# Patient Record
Sex: Male | Born: 1970 | Race: White | Hispanic: No | Marital: Married | Smoking: Never smoker
Health system: Southern US, Community
[De-identification: ages and names within clinical notes are randomized; demographics above are authoritative.]

## PROBLEM LIST (undated history)

## (undated) DIAGNOSIS — R51 Headache: Secondary | ICD-10-CM

## (undated) DIAGNOSIS — G8929 Other chronic pain: Secondary | ICD-10-CM

## (undated) DIAGNOSIS — G4730 Sleep apnea, unspecified: Secondary | ICD-10-CM

## (undated) HISTORY — PX: KNEE ARTHROSCOPY: SHX127

---

## 2009-08-31 ENCOUNTER — Emergency Department (HOSPITAL_BASED_OUTPATIENT_CLINIC_OR_DEPARTMENT_OTHER): Admission: EM | Admit: 2009-08-31 | Discharge: 2009-09-01 | Payer: Self-pay | Admitting: Emergency Medicine

## 2013-05-14 ENCOUNTER — Encounter (HOSPITAL_BASED_OUTPATIENT_CLINIC_OR_DEPARTMENT_OTHER): Payer: Self-pay | Admitting: Emergency Medicine

## 2013-05-14 ENCOUNTER — Emergency Department (HOSPITAL_BASED_OUTPATIENT_CLINIC_OR_DEPARTMENT_OTHER)
Admission: EM | Admit: 2013-05-14 | Discharge: 2013-05-14 | Disposition: A | Discharge status: Home / Self Care | Payer: Self-pay | Attending: Emergency Medicine | Admitting: Emergency Medicine

## 2013-05-14 DIAGNOSIS — K029 Dental caries, unspecified: Secondary | ICD-10-CM | POA: Insufficient documentation

## 2013-05-14 DIAGNOSIS — Z8669 Personal history of other diseases of the nervous system and sense organs: Secondary | ICD-10-CM | POA: Insufficient documentation

## 2013-05-14 HISTORY — DX: Sleep apnea, unspecified: G47.30

## 2013-05-14 MED ORDER — IBUPROFEN 800 MG PO TABS: 800.0000 mg | ORAL_TABLET | Freq: Three times a day (TID) | ORAL | Status: AC | PRN

## 2013-05-14 MED ORDER — TRAMADOL HCL 50 MG PO TABS: 50.0000 mg | ORAL_TABLET | Freq: Four times a day (QID) | ORAL | Status: AC | PRN

## 2013-05-14 MED ORDER — PENICILLIN V POTASSIUM 500 MG PO TABS
500.0000 mg | ORAL_TABLET | Freq: Four times a day (QID) | ORAL | Status: AC
Start: 2013-05-14 — End: ?

## 2013-05-14 NOTE — ED Provider Notes (Signed)
TIME SEEN: 2:12 PM  CHIEF COMPLAINT: dental pain   HPI: Patient is a 43 y.o. male with a history of obstructive sleep apnea who presents to the emergency department with dental pain that started last night. Patient reports that he was eating and one of his left lower teeth broke causing him to have pain. He denies that he's had any swelling of his face or gums or neck. No difficulty swallowing or breathing. No fever. No drainage. He does not have a dentist. Patient has very poor dentition and he reports that he uses dip.  ROS: See HPI Constitutional: no fever  Eyes: no drainage  ENT: no runny nose   Cardiovascular:  no chest pain  Resp: no SOB  GI: no vomiting GU: no dysuria Integumentary: no rash  Allergy: no hives  Musculoskeletal: no leg swelling  Neurological: no slurred speech ROS otherwise negative  PAST MEDICAL HISTORY/PAST SURGICAL HISTORY:  Past Medical History  Diagnosis Date  . Sleep apnea     MEDICATIONS:  Prior to Admission medications   Medication Sig Start Date End Date Taking? Authorizing Provider  acetaminophen (TYLENOL) 500 MG tablet Take 1,000 mg by mouth every 6 (six) hours as needed.   Yes Historical Provider, MD    ALLERGIES:  Allergies no known allergies  SOCIAL HISTORY:  History  Substance Use Topics  . Smoking status: Never Smoker   . Smokeless tobacco: Current User  . Alcohol Use: No    FAMILY HISTORY: History reviewed. No pertinent family history.  EXAM: BP 154/97  Pulse 75  Temp(Src) 98.2 F (36.8 C)  Resp 16  Ht 6\' 6"  (1.981 m)  Wt 300 lb (136.079 kg)  BMI 34.68 kg/m2  SpO2 98% CONSTITUTIONAL: Alert and oriented and responds appropriately to questions. Well-appearing; well-nourished HEAD: Normocephalic EYES: Conjunctivae clear, PERRL ENT: normal nose; no rhinorrhea; moist mucous membranes; pharynx without lesions noted; no trismus or drooling, no uvular deviation, no muffled voice, no submandibular swelling, no swelling  underneath the tongue, patient has multiple dental caries, patient has multiple fractured teeth including left lower canine and molar that are tender to palpation with no gum swelling or erythema or purulent drainage NECK: Supple, no meningismus, no LAD  CARD: RRR; S1 and S2 appreciated; no murmurs, no clicks, no rubs, no gallops RESP: Normal chest excursion without splinting or tachypnea; breath sounds clear and equal bilaterally; no wheezes, no rhonchi, no rales,  ABD/GI: Normal bowel sounds; non-distended; soft, non-tender, no rebound, no guarding BACK:  The back appears normal and is non-tender to palpation, there is no CVA tenderness EXT: Normal ROM in all joints; non-tender to palpation; no edema; normal capillary refill; no cyanosis    SKIN: Normal color for age and race; warm NEURO: Moves all extremities equally PSYCH: The patient's mood and manner are appropriate. Grooming and personal hygiene are appropriate.  MEDICAL DECISION MAKING: Patient here with fractured teeth, dental caries without obvious sign of infection. Have discussed with patient that he needs to followup with a dentist. We'll discharge with prescription for ibuprofen, tramadol and penicillin. Have given return precautions. Patient verbalizes understanding and is comfortable with plan.      Layla MawKristen N Saman Umstead, DO 05/14/13 1415

## 2013-05-14 NOTE — Discharge Instructions (Signed)
Dental Caries  Dental caries (also called tooth decay) is the most common oral disease. It can occur at any age, but is more common in children and young adults.  HOW DENTAL CARIES DEVELOPS  The process of decay begins when bacteria and foods (particularly sugars and starches) combine in your mouth to produce plaque. Plaque is a substance that sticks to the hard, outer surface of a tooth (enamel). The bacteria in plaque produce acids that attack enamel. These acids may also attack the root surface of a tooth (cementum) if it is exposed. Repeated attacks dissolve these surfaces and create holes in the tooth (cavities). If left untreated, the acids destroy the other layers of the tooth.  RISK FACTORS  Frequent sipping of sugary beverages.   Frequent snacking on sugary and starchy foods, especially those that easily get stuck in the teeth.   Poor oral hygiene.   Dry mouth.   Substance abuse such as methamphetamine abuse.   Broken or poor-fitting dental restorations.   Eating disorders.   Gastroesophageal reflux disease (GERD).   Certain radiation treatments to the head and neck. SYMPTOMS In the early stages of dental caries, symptoms are seldom present. Sometimes white, chalky areas may be seen on the enamel or other tooth layers. In later stages, symptoms may include:  Pits and holes on the enamel.  Toothache after sweet, hot, or cold foods or drinks are consumed.  Pain around the tooth.  Swelling around the tooth. DIAGNOSIS  Most of the time, dental caries is detected during a regular dental checkup. A diagnosis is made after a thorough medical and dental history is taken and the surfaces of your teeth are checked for signs of dental caries. Sometimes special instruments, such as lasers, are used to check for dental caries. Dental X-ray exams may be taken so that areas not visible to the eye (such as between the contact areas of the teeth) can be checked for cavities.    TREATMENT  If dental caries is in its early stages, it may be reversed with a fluoride treatment or an application of a remineralizing agent at the dental office. Thorough brushing and flossing at home is needed to aid these treatments. If it is in its later stages, treatment depends on the location and extent of tooth destruction:   If a small area of the tooth has been destroyed, the destroyed area will be removed and cavities will be filled with a material such as gold, silver amalgam, or composite resin.   If a large area of the tooth has been destroyed, the destroyed area will be removed and a cap (crown) will be fitted over the remaining tooth structure.   If the center part of the tooth (pulp) is affected, a procedure called a root canal will be needed before a filling or crown can be placed.   If most of the tooth has been destroyed, the tooth may need to be pulled (extracted). HOME CARE INSTRUCTIONS You can prevent, stop, or reverse dental caries at home by practicing good oral hygiene. Good oral hygiene includes:  Thoroughly cleaning your teeth at least twice a day with a toothbrush and dental floss.   Using a fluoride toothpaste. A fluoride mouth rinse may also be used if recommended by your dentist or health care provider.   Restricting the amount of sugary and starchy foods and sugary liquids you consume.   Avoiding frequent snacking on these foods and sipping of these liquids.   Keeping regular visits  with a dentist for checkups and cleanings. °PREVENTION  °· Practice good oral hygiene. °· Consider a dental sealant. A dental sealant is a coating material that is applied by your dentist to the pits and grooves of teeth. The sealant prevents food from being trapped in them. It may protect the teeth for several years. °· Ask about fluoride supplements if you live in a community without fluorinated water or with water that has a low fluoride content. Use fluoride supplements  as directed by your dentist or health care provider. °· Allow fluoride varnish applications to teeth if directed by your dentist or health care provider. °Document Released: 12/16/2001 Document Revised: 11/26/2012 Document Reviewed: 03/28/2012 °ExitCare® Patient Information ©2014 [employer]. ° ° °Emergency Department Resource Guide °1) Find a Doctor and Pay Out of Pocket °Although you won't have to find out who is covered by your insurance plan, it is a good idea to ask around and get recommendations. You will then need to call the office and see if the doctor you have chosen will accept you as a new patient and what types of options they offer for patients who are self-pay. Some doctors offer discounts or will set up payment plans for their patients who do not have insurance, but you will need to ask so you aren't surprised when you get to your appointment. ° °2) Contact Your Local Health Department °Not all health departments have doctors that can see patients for sick visits, but many do, so it is worth a call to see if yours does. If you don't know where your local health department is, you can check in your phone book. The CDC also has a tool to help you locate your state's health department, and many state websites also have listings of all of their local health departments. ° °3) Find a Walk-in Clinic °If your illness is not likely to be very severe or complicated, you may want to try a walk in clinic. These are popping up all over the country in pharmacies, drugstores, and shopping centers. They're usually staffed by nurse practitioners or physician assistants that have been trained to treat common illnesses and complaints. They're usually fairly quick and inexpensive. However, if you have serious medical issues or chronic medical problems, these are probably not your best option. ° °No Primary Care Doctor: °- Call Health Connect at  [phone] - they can help you locate a primary care doctor that  accepts  your insurance, provides certain services, etc. °- Physician Referral Service- 1-800-533-3463 ° °Chronic Pain Problems: °Organization         Address  Phone   Notes  °[facility]  ([phone] Patients need to be referred by their primary care doctor.  ° °Medication Assistance: °Organization         Address  Phone   Notes  °[place] [facility] [address] °[place], [place] [address] [phone] --Must be a resident of [place] °-- Must have NO insurance coverage whatsoever (no Medicaid/ Medicare, etc.) °-- The pt. MUST have a primary care doctor that directs their care regularly and follows them in the community °  °[facility]  (866) 331-1348   °[employer]  (888) 892-1162   ° °Agencies that provide inexpensive medical care: °Organization         Address  Phone   Notes  °[facility]  ([phone]   °[facility]    ([phone]   °  [facility] [place], [place] [address] [phone]   [place]. [address] [place] [phone]   Planned Parenthood    [phone]   [facility]    [phone]   [place] and [place] [address], [place] Phone:  [phone], Fax:  [phone] Hours of Operation:  9 am - 6 pm, M-F.  Also accepts Medicaid/Medicare and self-pay.  [facility] for [place] [address], [address], [place] Phone: [phone], Fax: [phone]. Hours of Operation:  8:30 am - 5:30 pm, M-F.  Also accepts Medicaid and self-pay.  [facility] [place] [address], [place] Phone: [phone]   [place], [place], [place] [phone], Ext. [phone] Mondays & Thursdays: 7-9 AM.  First 15 patients are seen on a first come, first serve basis.    [facility] Providers:  Organization          Address  Phone   Notes  [facility] [address], [place] [phone] Also accepts self-pay patients.  [facility] [id number] [place], [place]  [phone]   [place], [address], [place] [phone]   [facility] [address], [place] [phone]   Lucianne Lei [address], [place]   [phone] Only accepts [place] Access [place] patients after they have their name applied to their card.   Self-Pay (no insurance) in [facility]:  Organization         Address  Phone   Notes  Sickle Cell Patients, [facility] [place] [phone]   [facility] [place] [phone]   [facility] [place]  [place], [place], [place] [phone]   [facility]/Dr. Osei-Bonsu  [address] [place] or [address], [place] [phone] Phone number for both [place] and [place] locations is the same.  [facility] [address], [place] [phone]   [facility] [address] [place] or [address] [phone]   [facility] [address], [place] [phone], phone; [phone], fax Sees patients 1st and 3rd Saturday of every month.  Must not qualify for public or private insurance (i.e. Medicaid, Medicare, [place] Health Choice, Veterans' Benefits)  Household income should be no more than 200% of the poverty level The clinic cannot treat you if you are pregnant or think you are pregnant  Sexually transmitted diseases are not treated at the clinic.    Dental Care: Organization         Address  Phone  Notes  [facility] of [facility] [place] [phone] Accepts children up to age 52 who are enrolled in [place] or [place]; pregnant women with a Medicaid card; and children who have applied for Medicaid or [place] Health Choice, but were declined, whose parents can pay a reduced fee at time of service.  [facility] Department of [facility]  [address], [place] [phone] Accepts children up to  age 53 who are enrolled in Medicaid or [place] Health Choice; pregnant women with a Medicaid card; and children who have applied for Medicaid or [place] Health Choice, but were declined, whose parents can pay a reduced fee at time of service.  [facility]  [place] [phone] Patients are seen by appointment only. Walk-ins are not accepted. [place] will see patients 51 years of age and older. Monday - Tuesday (8am-5pm) Most Wednesdays (8:30-5pm) $30 per visit, cash only  [facility] Adult Dental Access PROGRAM  [address], [facility] [phone] Patients are seen by appointment only. Walk-ins are not accepted. [place] will see patients 26 years of age and older. One Wednesday Evening (Monthly: Volunteer Based).  $30 per visit, cash only  [place]  [phone] for adults; Children under age 75, call Graduate Pediatric Dentistry at [phone]. Children aged 22-14, please call [phone] to request a pediatric application.  Dental services are provided in all areas of dental care including fillings, crowns and bridges, complete and partial dentures, implants, gum treatment, root canals, and extractions. Preventive care is also provided. Treatment is provided to both adults and children. Patients are selected via a lottery and there is often a waiting list.   [facility] [address] [place]  [phone] [email or web address]   [facility] [address] [place], [place]  [phone], Ext. [phone] Second and Fourth Thursday of each month, opens at 6:30 AM; Clinic ends at 9 AM.  Patients are seen on a first-come first-served basis, and a limited number are seen during each clinic.   [facility]  [address] Hillard Danker [place], [place] [phone]   Eligibility Requirements You must have lived in [place], [place], or [place] counties for at least the last three months.   You cannot be eligible for state or federal sponsored [employer], including [employer], [place], or [employer].   You generally cannot be eligible for healthcare insurance through your employer.    How to apply: Eligibility screenings are held every Tuesday and Wednesday afternoon from 1:00 pm until 4:00 pm. You do not need an appointment for the interview!  [facility] [address] [place], [place]   [place]  [place] Department  [place]  [phone]    Behavioral Health Resources in the Community: Intensive Outpatient Programs Organization         Address  Phone  Notes  [place]. [address], [place], [place] [phone]   [facility] [address], [place], [place]   ADS: [facility] [address] [place], [place]   [place] [address],  [place], [place] or [phone]   Substance Abuse Resources Organization         Address  Phone  Notes  Al[facility] Drug Services  [phone]   [place]  [phone]   [facility] [place]   Chinita Pester  [phone]   Residential & Outpatient Substance Abuse Program  [phone]   Psychological Services Organization         Address  Phone  Notes  [place]  [place]  [place]    [place] [phone]  [address], [place] or [phone]    Mobile Crisis Teams Organization         Address  Phone  Notes  [facility], Mobile Crisis Care Unit  [phone]   [facility]  [address] [place], [place]   [place] [address], [place] [phone]    Self-Help/Support Groups Organization         Address  Phone             Notes  [place] of [place] - variety of support groups  [place] Negron Torres Call for more information  Narcotics Anonymous (NA), [facility] [address], [employer]   2 meetings at this location   Special educational needs teacher         Address  Phone  Notes  [facility] [place],    [place]  [phone]-([phone]   [facility]  [address], [place] [id number], [place], [place]   [place], [place] [phone] Admissions: 8am-3pm M-F  Incentives Substance [place] [address],    [place], [place] [phone]   [facility] [address] [place], [place], [place]   [facility] [address],  [place], [place]   [facility] - Intensive Outpatient [address] Kristeen [address], [place], [place]   [facility].,  [place], [place] [phone] or [phone]   [facility] ([facility]) [address], [place], [place] Accepts Medicaid  [facility] [place] [address]  [place] 1-8041942016 Substance Abuse/Addiction Treatment   [facility] Organization         Address  Phone  Notes  [facility]  [phone]   Domenic Schwab, PhD [address] Arlis Porta [place], [place]   [phone] or [phone]   [place]  [place], [place] [phone]   [facility] [address], [place], [place] [phone] Insurance/Medicaid/sponsorship through [facility] and Families [address], [address] [place]                                    [place], [place] [phone] [place] [address], [place] [phone]    Dr. Adele Schilder  [phone]   [facility] Dept. 1) [address] [place] 2) [place] 3)  [place] [address], [place] [phone]  [phone]   [place] [phone] or [phone] (After Hours)

## 2013-05-14 NOTE — ED Notes (Signed)
Dental pain x 1 day.

## 2013-06-16 ENCOUNTER — Emergency Department (HOSPITAL_BASED_OUTPATIENT_CLINIC_OR_DEPARTMENT_OTHER): Payer: Self-pay

## 2013-06-16 ENCOUNTER — Encounter (HOSPITAL_BASED_OUTPATIENT_CLINIC_OR_DEPARTMENT_OTHER): Payer: Self-pay | Admitting: Emergency Medicine

## 2013-06-16 ENCOUNTER — Emergency Department (HOSPITAL_BASED_OUTPATIENT_CLINIC_OR_DEPARTMENT_OTHER)
Admission: EM | Admit: 2013-06-16 | Discharge: 2013-06-16 | Disposition: A | Discharge status: Home / Self Care | Payer: Self-pay | Attending: Emergency Medicine | Admitting: Emergency Medicine

## 2013-06-16 DIAGNOSIS — S51009A Unspecified open wound of unspecified elbow, initial encounter: Secondary | ICD-10-CM | POA: Insufficient documentation

## 2013-06-16 DIAGNOSIS — W19XXXA Unspecified fall, initial encounter: Secondary | ICD-10-CM

## 2013-06-16 DIAGNOSIS — Y92009 Unspecified place in unspecified non-institutional (private) residence as the place of occurrence of the external cause: Secondary | ICD-10-CM | POA: Insufficient documentation

## 2013-06-16 DIAGNOSIS — Y939 Activity, unspecified: Secondary | ICD-10-CM | POA: Insufficient documentation

## 2013-06-16 DIAGNOSIS — W108XXA Fall (on) (from) other stairs and steps, initial encounter: Secondary | ICD-10-CM | POA: Insufficient documentation

## 2013-06-16 DIAGNOSIS — W1809XA Striking against other object with subsequent fall, initial encounter: Secondary | ICD-10-CM | POA: Insufficient documentation

## 2013-06-16 DIAGNOSIS — S51012A Laceration without foreign body of left elbow, initial encounter: Secondary | ICD-10-CM

## 2013-06-16 DIAGNOSIS — Z792 Long term (current) use of antibiotics: Secondary | ICD-10-CM | POA: Insufficient documentation

## 2013-06-16 DIAGNOSIS — S59909A Unspecified injury of unspecified elbow, initial encounter: Secondary | ICD-10-CM

## 2013-06-16 MED ORDER — OXYCODONE-ACETAMINOPHEN 5-325 MG PO TABS
2.0000 | ORAL_TABLET | Freq: Once | ORAL | Status: AC
  Administered 2013-06-16: 2 via ORAL

## 2013-06-16 MED ORDER — OXYCODONE-ACETAMINOPHEN 5-325 MG PO TABS: 1.0000 | ORAL_TABLET | Freq: Four times a day (QID) | ORAL | Status: AC | PRN

## 2013-06-16 MED ORDER — OXYCODONE-ACETAMINOPHEN 5-325 MG PO TABS
ORAL_TABLET | ORAL | Status: AC
  Filled 2013-06-16: qty 2

## 2013-06-16 NOTE — ED Notes (Signed)
Fell down steps at home approx 1 hour PTA-lac noted to left elbow-bleeding controlled-gauze/kling applied in triage

## 2013-06-16 NOTE — ED Notes (Signed)
MD at bedside. 

## 2013-06-16 NOTE — Discharge Instructions (Signed)

## 2013-06-16 NOTE — ED Notes (Signed)
MD at bedside for wound repair.

## 2013-06-16 NOTE — ED Notes (Signed)
Patient transported to X-ray 

## 2013-06-16 NOTE — ED Provider Notes (Signed)
CSN: [id number]     Arrival date & time 06/16/13  2000 History  This chart was scribed for Dagmar HaitWilliam Nur Krasinski, MD by Blanchard KelchNicole Curnes, ED Scribe. The patient was seen in room MH07/MH07. Patient's care was started at 9:10 PM.    Chief Complaint  Patient presents with  . Fall     Patient is a 43 y.o. male presenting with fall. The history is provided by the patient. No language interpreter was used.  Fall This is a new problem. The current episode started 1 to 2 hours ago. The problem occurs constantly. The problem has not changed since onset.The symptoms are aggravated by bending.    HPI Comments: Roberto ParkinsLonnie Zimmerman is a 43 y.o. male who presents to the Emergency Department due to a fall that occurred about an hour ago when he slipped and fell down thirteen stairs. He denies hitting his head or losing consciousness. He states that he hit his left elbow during the fall causing a laceration to the area. The bleeding is currently controlled. He reports constant pain to the affected area. The pain is worsened by bending. He denies numbness or tingling in his extremities. He denies decreased ROM of the left arm. He is not currently taking any anticoagulants.    Past Medical History  Diagnosis Date  . Sleep apnea    Past Surgical History  Procedure Laterality Date  . Knee arthroscopy     No family history on file. History  Substance Use Topics  . Smoking status: Never Smoker   . Smokeless tobacco: Current User  . Alcohol Use: No    Review of Systems  Musculoskeletal: Positive for myalgias.  Skin: Positive for wound.  Neurological: Negative for syncope and numbness.  All other systems reviewed and are negative.      Allergies  Review of patient's allergies indicates no known allergies.  Home Medications   Current Outpatient Rx  Name  Route  Sig  Dispense  Refill  . acetaminophen (TYLENOL) 500 MG tablet   Oral   Take 1,000 mg by mouth every 6 (six) hours as needed.         Marland Kitchen.  ibuprofen (ADVIL,MOTRIN) 800 MG tablet   Oral   Take 1 tablet (800 mg total) by mouth every 8 (eight) hours as needed for mild pain.   30 tablet   0   . penicillin v potassium (VEETID) 500 MG tablet   Oral   Take 1 tablet (500 mg total) by mouth 4 (four) times daily.   40 tablet   0   . traMADol (ULTRAM) 50 MG tablet   Oral   Take 1 tablet (50 mg total) by mouth every 6 (six) hours as needed for moderate pain.   25 tablet   0    Triage Vitals: BP 137/91  Pulse 122  Temp(Src) 98.7 F (37.1 C) (Oral)  Resp 20  Ht 6\' 5"  (1.956 m)  Wt 300 lb (136.079 kg)  BMI 35.57 kg/m2  SpO2 98%  Physical Exam  Nursing note and vitals reviewed. Constitutional: He is oriented to person, place, and time. He appears well-developed and well-nourished. No distress.  HENT:  Head: Normocephalic and atraumatic.  Eyes: EOM are normal.  Neck: Neck supple. No tracheal deviation present.  Cardiovascular: Normal rate, regular rhythm and normal heart sounds.  Exam reveals no gallop and no friction rub.   No murmur heard. Pulmonary/Chest: Effort normal and breath sounds normal. No respiratory distress. He has no wheezes. He has  no rales.  Abdominal: Soft. Bowel sounds are normal. He exhibits no distension. There is no tenderness.  Musculoskeletal: Normal range of motion.  Neurological: He is alert and oriented to person, place, and time.  Skin: Skin is warm and dry.  Small 2 cm laceration at left olecranon. Full ROM. NVI distally.   Psychiatric: He has a normal mood and affect. His behavior is normal.    ED Course  LACERATION REPAIR Date/Time: 06/16/2013 11:19 PM Performed by: Dagmar Hait Authorized by: Dagmar Hait Consent: Verbal consent obtained. Body area: upper extremity Location details: left elbow Laceration length: 2 cm Foreign bodies: no foreign bodies Tendon involvement: none Nerve involvement: none Vascular damage: no Anesthesia: local infiltration Local  anesthetic: lidocaine 1% with epinephrine Anesthetic total: 6 ml Patient sedated: no Preparation: Patient was prepped and draped in the usual sterile fashion. Irrigation solution: saline Irrigation method: jet lavage Amount of cleaning: standard Debridement: none Degree of undermining: none Skin closure: 3-0 Prolene Number of sutures: 4 Technique: simple Approximation: close Approximation difficulty: simple Dressing: 4x4 sterile gauze Patient tolerance: Patient tolerated the procedure well with no immediate complications.   (including critical care time)  DIAGNOSTIC STUDIES: Oxygen Saturation is 98% on room air, normal by my interpretation.    COORDINATION OF CARE: 9:12 PM - Patient verbalizes understanding and agrees with treatment plan.   Labs Review Labs Reviewed - No data to display Imaging Review Dg Elbow Complete Left  06/16/2013   CLINICAL DATA Fall, pain  EXAM LEFT ELBOW - COMPLETE 3+ VIEW  COMPARISON None.  FINDINGS There is no evidence of fracture, dislocation, or joint effusion. There is no evidence of arthropathy or other focal bone abnormality. Soft tissues are unremarkable.  IMPRESSION Negative.  SIGNATURE  Electronically Signed   By: Elige Ko   On: 06/16/2013 21:09     EKG Interpretation None      MDM   Final diagnoses:  Laceration of left elbow  Fall  Elbow injury    24M s/p fall down stairs. Hit L elbow sustaining 2 cm laceration to elbow at olecranon. No head injury, LOC. Normal spine, chest, abdominal exam. No other extremity injury identified. L elbow irrigated, sutured as above. Given pain meds.   I personally performed the services described in this documentation, which was scribed in my presence. The recorded information has been reviewed and is accurate. I have reviewed all labs and imaging and considered them in my medical decision making.     Dagmar Hait, MD 06/16/13 [phone]

## 2014-12-01 IMAGING — CR DG ELBOW COMPLETE 3+V*L*
4 series · 4 of 4 positions shown · non-contrast
Comparison: none

[x elbow joint ap left]
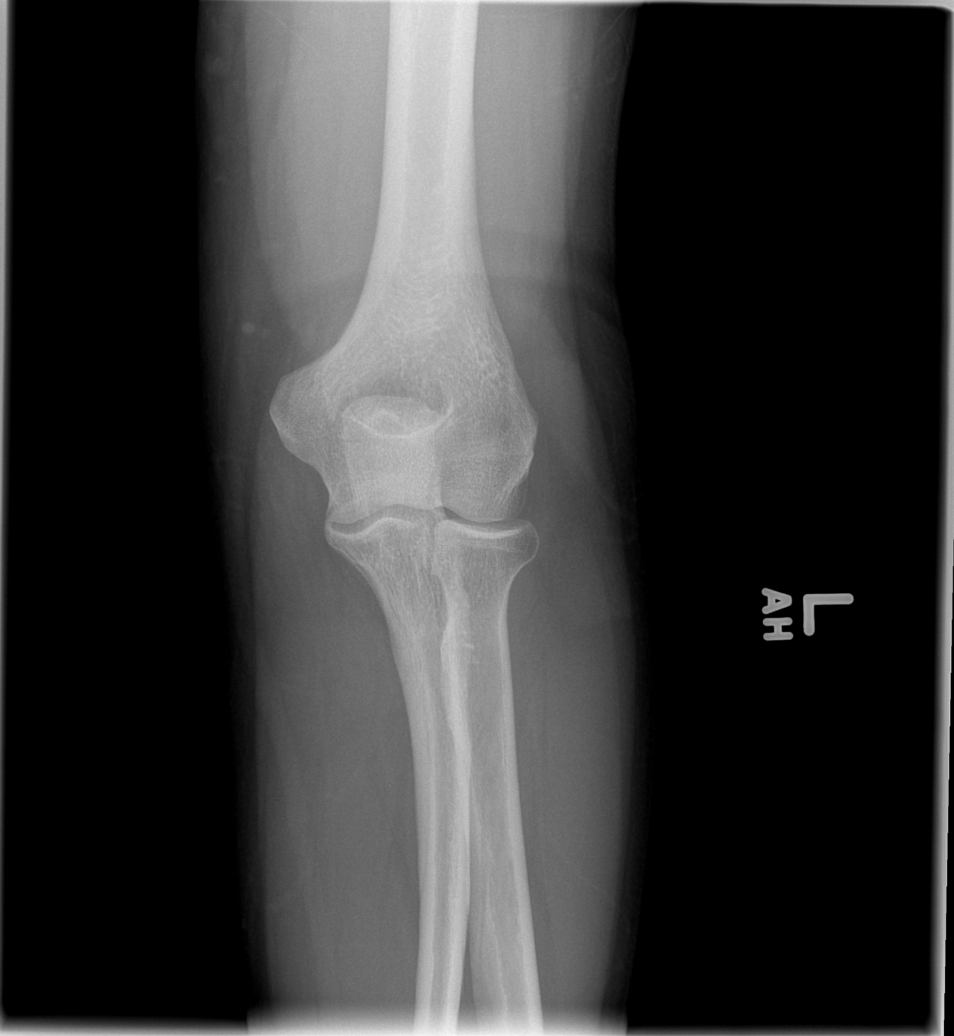

[x elbow joint obl. left (1 of 2)]
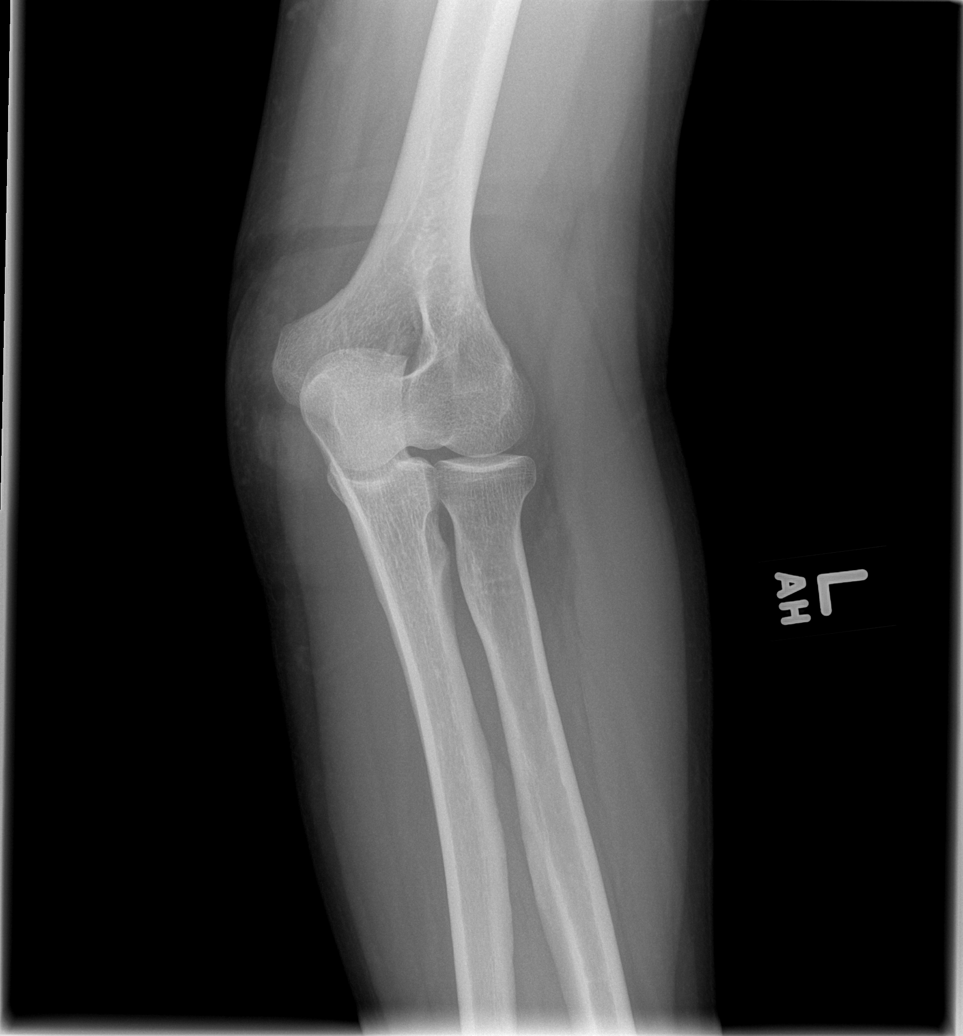

[x elbow joint obl. left (2 of 2)]
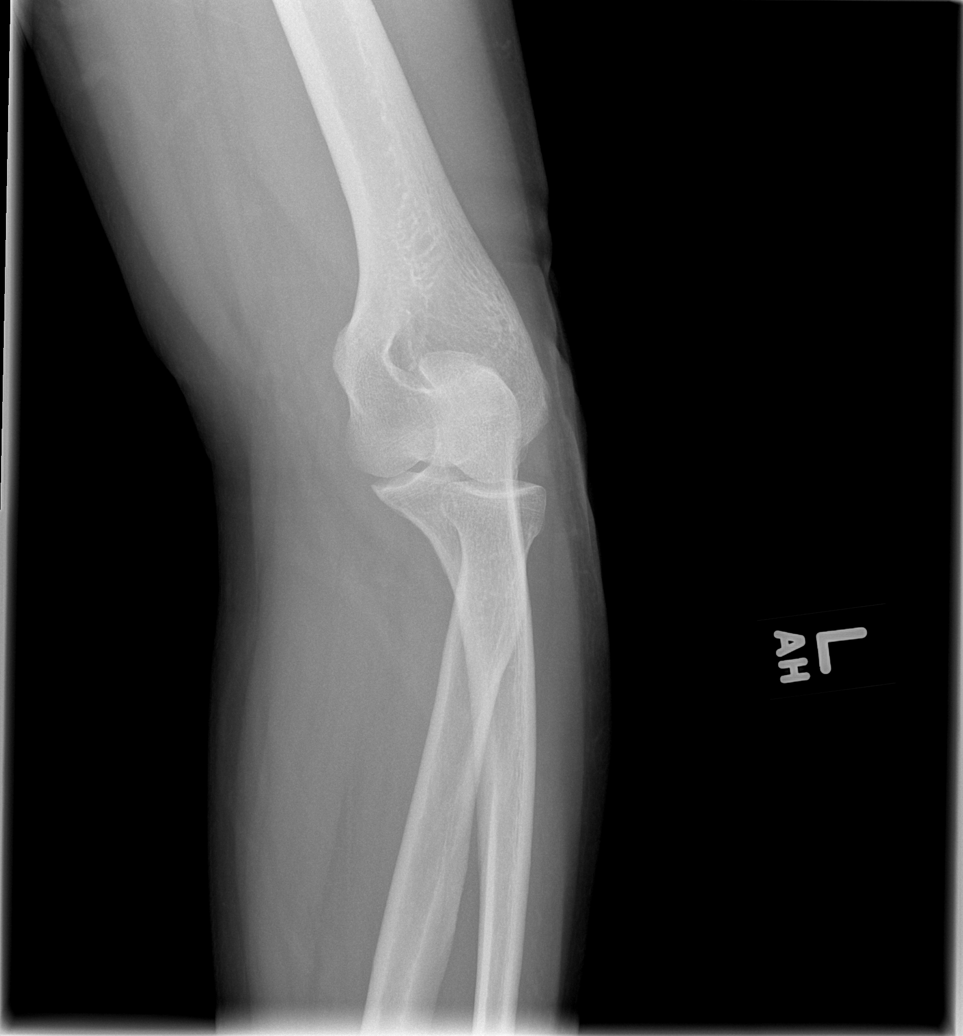

[x elbow joint lat left]
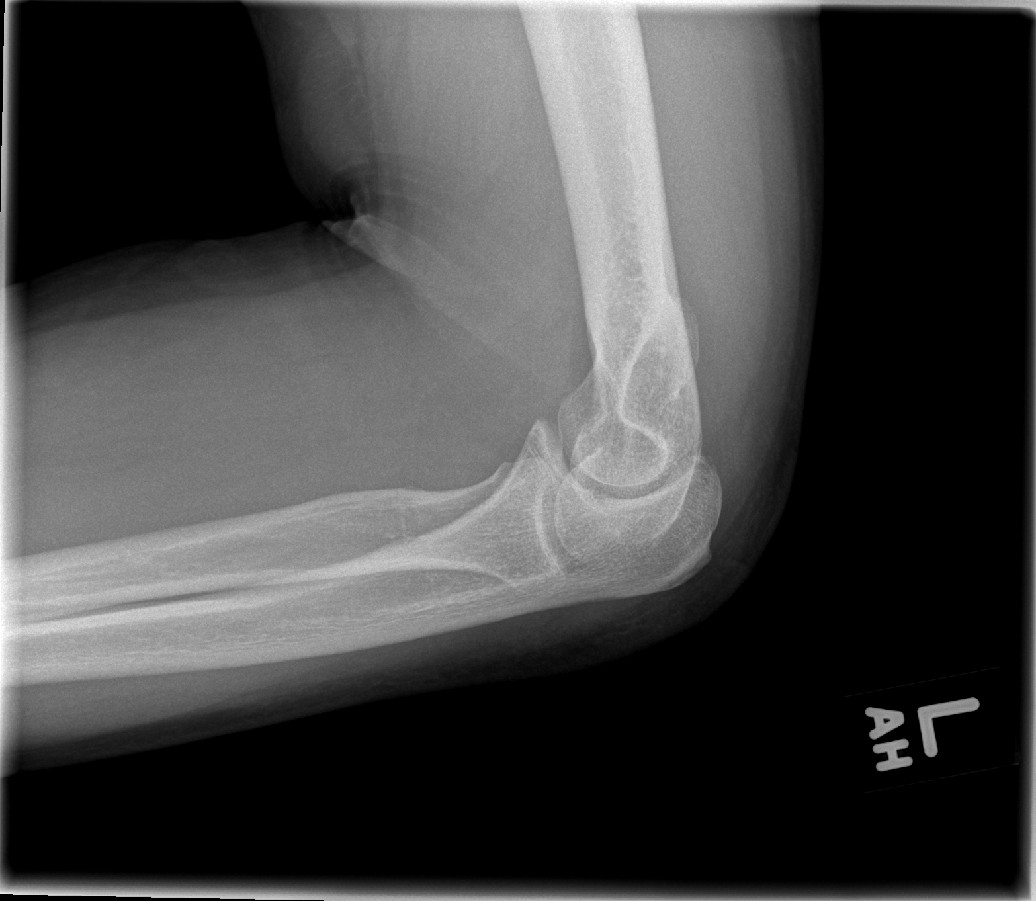

[4 of 4 positions shown; findings below may reference images not displayed]

CLINICAL DATA
Fall, pain

EXAM
LEFT ELBOW - COMPLETE 3+ VIEW

COMPARISON
None.

FINDINGS
There is no evidence of fracture, dislocation, or joint effusion.
There is no evidence of arthropathy or other focal bone abnormality.
Soft tissues are unremarkable.

IMPRESSION
Negative.

SIGNATURE

## 2015-10-17 ENCOUNTER — Encounter (HOSPITAL_BASED_OUTPATIENT_CLINIC_OR_DEPARTMENT_OTHER): Payer: Self-pay | Admitting: *Deleted

## 2015-10-17 ENCOUNTER — Emergency Department (HOSPITAL_BASED_OUTPATIENT_CLINIC_OR_DEPARTMENT_OTHER)
Admission: EM | Admit: 2015-10-17 | Discharge: 2015-10-17 | Disposition: A | Discharge status: Home / Self Care | Payer: Self-pay | Attending: Emergency Medicine | Admitting: Emergency Medicine

## 2015-10-17 DIAGNOSIS — Y9389 Activity, other specified: Secondary | ICD-10-CM | POA: Insufficient documentation

## 2015-10-17 DIAGNOSIS — Y92524 Gas station as the place of occurrence of the external cause: Secondary | ICD-10-CM | POA: Insufficient documentation

## 2015-10-17 DIAGNOSIS — T23201A Burn of second degree of right hand, unspecified site, initial encounter: Secondary | ICD-10-CM | POA: Insufficient documentation

## 2015-10-17 DIAGNOSIS — F17200 Nicotine dependence, unspecified, uncomplicated: Secondary | ICD-10-CM | POA: Insufficient documentation

## 2015-10-17 DIAGNOSIS — X16XXXA Contact with hot heating appliances, radiators and pipes, initial encounter: Secondary | ICD-10-CM | POA: Insufficient documentation

## 2015-10-17 DIAGNOSIS — Y999 Unspecified external cause status: Secondary | ICD-10-CM | POA: Insufficient documentation

## 2015-10-17 DIAGNOSIS — Z79899 Other long term (current) drug therapy: Secondary | ICD-10-CM | POA: Insufficient documentation

## 2015-10-17 HISTORY — DX: Headache: R51

## 2015-10-17 HISTORY — DX: Other chronic pain: G89.29

## 2015-10-17 MED ORDER — SILVER SULFADIAZINE 1 % EX CREA: 1.0000 "application " | TOPICAL_CREAM | Freq: Every day | CUTANEOUS | Status: AC

## 2015-10-17 MED ORDER — OXYCODONE-ACETAMINOPHEN 5-325 MG PO TABS
2.0000 | ORAL_TABLET | Freq: Once | ORAL | Status: AC
  Administered 2015-10-17: 2 via ORAL
  Filled 2015-10-17: qty 2

## 2015-10-17 MED ORDER — SILVER SULFADIAZINE 1 % EX CREA
TOPICAL_CREAM | Freq: Once | CUTANEOUS | Status: AC
  Administered 2015-10-17: 1 via TOPICAL
  Filled 2015-10-17: qty 85
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
## 2015-10-17 NOTE — ED Notes (Signed)
PA at bedside.
